# Patient Record
Sex: Female | Born: 1970 | Race: White | Hispanic: No | Marital: Married | Smoking: Former smoker
Health system: Southern US, Community
[De-identification: ages and names within clinical notes are randomized; demographics above are authoritative.]

## PROBLEM LIST (undated history)

## (undated) DIAGNOSIS — Q2381 Bicuspid aortic valve: Secondary | ICD-10-CM

## (undated) DIAGNOSIS — E079 Disorder of thyroid, unspecified: Secondary | ICD-10-CM

## (undated) DIAGNOSIS — F419 Anxiety disorder, unspecified: Secondary | ICD-10-CM

## (undated) DIAGNOSIS — F32A Depression, unspecified: Secondary | ICD-10-CM

## (undated) DIAGNOSIS — I719 Aortic aneurysm of unspecified site, without rupture: Secondary | ICD-10-CM

## (undated) DIAGNOSIS — K219 Gastro-esophageal reflux disease without esophagitis: Secondary | ICD-10-CM

## (undated) DIAGNOSIS — J45909 Unspecified asthma, uncomplicated: Secondary | ICD-10-CM

## (undated) DIAGNOSIS — R000 Tachycardia, unspecified: Secondary | ICD-10-CM

## (undated) DIAGNOSIS — Q231 Congenital insufficiency of aortic valve: Secondary | ICD-10-CM

## (undated) HISTORY — PX: BLADDER SUSPENSION: SHX72

## (undated) HISTORY — PX: ABDOMINAL HYSTERECTOMY: SHX81

## (undated) HISTORY — PX: TONSILLECTOMY: SUR1361

## (undated) HISTORY — PX: CHOLECYSTECTOMY: SHX55

## (undated) HISTORY — DX: Gastro-esophageal reflux disease without esophagitis: K21.9

---

## 2021-05-22 ENCOUNTER — Other Ambulatory Visit: Payer: Self-pay

## 2021-05-22 ENCOUNTER — Emergency Department (INDEPENDENT_AMBULATORY_CARE_PROVIDER_SITE_OTHER)
Admission: EM | Admit: 2021-05-22 | Discharge: 2021-05-22 | Disposition: A | Discharge status: Home / Self Care | Payer: Medicaid Other | Source: Home / Self Care

## 2021-05-22 ENCOUNTER — Encounter: Payer: Self-pay | Admitting: Emergency Medicine

## 2021-05-22 ENCOUNTER — Emergency Department (INDEPENDENT_AMBULATORY_CARE_PROVIDER_SITE_OTHER): Payer: Medicaid Other

## 2021-05-22 DIAGNOSIS — S93402A Sprain of unspecified ligament of left ankle, initial encounter: Secondary | ICD-10-CM | POA: Diagnosis not present

## 2021-05-22 DIAGNOSIS — S99912A Unspecified injury of left ankle, initial encounter: Secondary | ICD-10-CM

## 2021-05-22 HISTORY — DX: Tachycardia, unspecified: R00.0

## 2021-05-22 HISTORY — DX: Congenital insufficiency of aortic valve: Q23.1

## 2021-05-22 HISTORY — DX: Bicuspid aortic valve: Q23.81

## 2021-05-22 HISTORY — DX: Depression, unspecified: F32.A

## 2021-05-22 HISTORY — DX: Aortic aneurysm of unspecified site, without rupture: I71.9

## 2021-05-22 HISTORY — DX: Disorder of thyroid, unspecified: E07.9

## 2021-05-22 HISTORY — DX: Anxiety disorder, unspecified: F41.9

## 2021-05-22 HISTORY — DX: Unspecified asthma, uncomplicated: J45.909

## 2021-05-22 NOTE — Discharge Instructions (Addendum)
Ice to area of swelling.  Follow up for recheck

## 2021-05-22 NOTE — ED Triage Notes (Signed)
LT ankle injury this morning fell down 4 steps. Took 2 Tylenol

## 2021-05-22 NOTE — ED Provider Notes (Signed)
[facility]    CSN: [id number] Arrival date & time: 05/22/21  1013      History   Chief Complaint Chief Complaint  Patient presents with  . Ankle Injury    HPI Kathy Snyder is a 50 y.o. female.   No language interpreter was used.  Ankle Injury This is a new problem. The problem occurs constantly. Nothing aggravates the symptoms. Nothing relieves the symptoms. She has tried nothing for the symptoms.  Pt reports she twisted her ankle going down stairs Pt complains of swelling and pain  Past Medical History:  Diagnosis Date  . Anxiety   . Aortic aneurysm (HCC)   . Asthma   . Bicuspid aortic valve   . Depression   . Tachycardia   . Thyroid disease     There are no problems to display for this patient.     OB History   No obstetric history on file.      Home Medications    Prior to Admission medications   Medication Sig Start Date End Date Taking? Authorizing Provider  albuterol (ACCUNEB) 0.63 MG/3ML nebulizer solution Take 1 ampule by nebulization every 6 (six) hours as needed for wheezing.   Yes [provider]  ALPRAZolam Prudy Feeler) 0.5 MG tablet Take 0.5 mg by mouth at bedtime as needed for anxiety.   Yes [provider]  atenolol (TENORMIN) 50 MG tablet Take 50 mg by mouth daily.   Yes [provider]  atorvastatin (LIPITOR) 10 MG tablet Take 10 mg by mouth daily.   Yes [provider]  fluticasone (FLONASE) 50 MCG/ACT nasal spray Place into both nostrils daily.   Yes [provider]  fluticasone-salmeterol (ADVAIR) 100-50 MCG/ACT AEPB Inhale 1 puff into the lungs 2 (two) times daily.   Yes [provider]  levothyroxine (SYNTHROID) 25 MCG tablet Take 25 mcg by mouth daily before breakfast.   Yes [provider]  Misc. Devices (HEART RATE MONITOR) MISC by Does not apply route.   Yes [provider]  tiotropium (SPIRIVA) 18 MCG inhalation capsule Place 18 mcg into inhaler and  inhale daily.   Yes [provider]    Family History Family History  Problem Relation Age of Onset  . COPD Mother   . Diabetes Father     Social History Social History   Tobacco Use  . Smoking status: Former Smoker    Types: Cigarettes    Quit date: 09/19/2020    Years since quitting: 0.6  . Smokeless tobacco: Never Used  Vaping Use  . Vaping Use: Never used  Substance Use Topics  . Alcohol use: Not Currently  . Drug use: Not Currently     Allergies   Avelox [moxifloxacin], Morphine and related, and Zoloft [sertraline]   Review of Systems Review of Systems  Musculoskeletal: Positive for joint swelling.  All other systems reviewed and are negative.    Physical Exam Triage Vital Signs ED Triage Vitals  Enc Vitals Group     BP 05/22/21 1056 122/80     Pulse Rate 05/22/21 1056 63     Resp 05/22/21 1056 18     Temp 05/22/21 1056 98.7 F (37.1 C)     Temp src --      SpO2 05/22/21 1056 96 %     Weight 05/22/21 1100 175 lb (79.4 kg)     Height 05/22/21 1100 5\' 4"  (1.626 m)     Head Circumference --      Peak  Flow --      Pain Score 05/22/21 1100 6     Pain Loc --      Pain Edu? --      Excl. in GC? --    No data found.  Updated Vital Signs BP 122/80 (BP Location: Left Arm)   Pulse 63   Temp 98.7 F (37.1 C)   Resp 18   Ht 5\' 4"  (1.626 m)   Wt 79.4 kg   SpO2 96%   BMI 30.04 kg/m   Visual Acuity Right Eye Distance:   Left Eye Distance:   Bilateral Distance:    Right Eye Near:   Left Eye Near:    Bilateral Near:     Physical Exam Vitals and nursing note reviewed.  Constitutional:      Appearance: She is well-developed.  HENT:     Head: Normocephalic.  Abdominal:     General: There is no distension.  Musculoskeletal:        General: Swelling and tenderness present. Normal range of motion.     Cervical back: Normal range of motion.  Neurological:     Mental Status: She is alert and oriented to person, place, and time.   Psychiatric:        Mood and Affect: Mood normal.      UC Treatments / Results  Labs (all labs ordered are listed, but only abnormal results are displayed) Labs Reviewed - No data to display  EKG   Radiology DG Ankle Complete Left  Result Date: 05/22/2021 CLINICAL DATA:  Left ankle injury after tripping over a baby gate. Lateral pain and swelling. EXAM: LEFT ANKLE COMPLETE - 3+ VIEW COMPARISON:  None. FINDINGS: Subcutaneous edema abdomen below the malleoli without malleolar fracture identified. There is some faint linear bony irregularity laterally along the calcaneus on the frontal projection, such that avulsion along the anterior process in the vicinity of the extensor digitorum brevis attachment is not excluded. On the lateral projection, there is some linear lucency in the anterior calcaneus which could be a secondary sign of fracture. The plafond and talar dome appear intact. Plantar and Achilles calcaneal spurs. IMPRESSION: 1. Possible fracture of the anterior calcaneus, CT is recommended for further characterization. Electronically Signed   By: 05/24/2021 M.D.   On: 05/22/2021 11:43    Procedures Procedures (including critical care time)  Medications Ordered in UC Medications - No data to display  Initial Impression / Assessment and Plan / UC Course  I have reviewed the triage vital signs and the nursing notes.  Pertinent labs & imaging results that were available during my care of the patient were reviewed by me and considered in my medical decision making (see chart for details).     MDM:  Area of pain and swelling not near xray abnormality.  Pt counseled on results.  Pt placed in a cam walker and given crutches Pt advised to see Dr. 05/24/2021 for recheck in 1 week  Final Clinical Impressions(s) / UC Diagnoses   Final diagnoses:  Sprain of left ankle, unspecified ligament, initial encounter     Discharge Instructions     Ice to area of swelling.  Follow up for  recheck    ED Prescriptions    None     PDMP not reviewed this encounter.  An After Visit Summary was printed and given to the patient.    Karie Schwalbe, Elson Areas 05/22/21 1202

## 2021-05-29 ENCOUNTER — Ambulatory Visit (INDEPENDENT_AMBULATORY_CARE_PROVIDER_SITE_OTHER): Payer: Medicaid Other | Admitting: Sports Medicine

## 2021-05-29 ENCOUNTER — Encounter: Payer: Self-pay | Admitting: Sports Medicine

## 2021-05-29 ENCOUNTER — Other Ambulatory Visit: Payer: Self-pay

## 2021-05-29 DIAGNOSIS — S92002A Unspecified fracture of left calcaneus, initial encounter for closed fracture: Secondary | ICD-10-CM | POA: Insufficient documentation

## 2021-05-29 DIAGNOSIS — S92025A Nondisplaced fracture of anterior process of left calcaneus, initial encounter for closed fracture: Secondary | ICD-10-CM | POA: Diagnosis not present

## 2021-05-29 MED ORDER — HYDROCODONE-ACETAMINOPHEN 10-325 MG PO TABS: 0.5000 | ORAL_TABLET | Freq: Three times a day (TID) | ORAL | 0 refills | Status: DC | PRN

## 2021-05-29 NOTE — Assessment & Plan Note (Signed)
This is a pleasant 50 year old female, a days ago she took a tumble down the stairs, inverted her left ankle, she had immediate pain, swelling, bruising. She was seen in urgent care, x-rays showed what appears to be a nondisplaced fracture through the anterior process of the calcaneus, I tend to agree, her foot is bruised, swollen, exquisitely tender. She also has some pain at the ATFL. She likely has an ATFL sprain and a nondisplaced anterior process calcaneal fracture, continue boot for at least 3 more weeks, adding some hydrocodone for pain, we discussed PRICE. Return to see me in about 3 weeks, we will get an x-ray before the visit and consider switching/transitioning into an ASO at that point.

## 2021-05-29 NOTE — Progress Notes (Signed)
    Procedures performed today:    None.  Independent interpretation of notes and tests performed by another provider:   X-ray personally reviewed, there does appear to be a nondisplaced fracture through the anterior process of the calcaneus.  Brief History, Exam, Impression, and Recommendations:    Fracture of anterior process of calcaneus, left, closed This is a pleasant 50 year old female, a days ago she took a tumble down the stairs, inverted her left ankle, she had immediate pain, swelling, bruising. She was seen in urgent care, x-rays showed what appears to be a nondisplaced fracture through the anterior process of the calcaneus, I tend to agree, her foot is bruised, swollen, exquisitely tender. She also has some pain at the ATFL. She likely has an ATFL sprain and a nondisplaced anterior process calcaneal fracture, continue boot for at least 3 more weeks, adding some hydrocodone for pain, we discussed PRICE. Return to see me in about 3 weeks, we will get an x-ray before the visit and consider switching/transitioning into an ASO at that point.    ___________________________________________ Ihor Austin. Benjamin Stain, M.D., ABFM., CAQSM. Primary Care and Sports Medicine [facility]  Adjunct Instructor of Family Medicine  [employer]

## 2021-06-19 ENCOUNTER — Ambulatory Visit (INDEPENDENT_AMBULATORY_CARE_PROVIDER_SITE_OTHER): Payer: Medicaid Other

## 2021-06-19 ENCOUNTER — Ambulatory Visit (INDEPENDENT_AMBULATORY_CARE_PROVIDER_SITE_OTHER): Payer: Medicaid Other | Admitting: Sports Medicine

## 2021-06-19 ENCOUNTER — Other Ambulatory Visit: Payer: Self-pay

## 2021-06-19 DIAGNOSIS — S92025D Nondisplaced fracture of anterior process of left calcaneus, subsequent encounter for fracture with routine healing: Secondary | ICD-10-CM | POA: Diagnosis not present

## 2021-06-19 DIAGNOSIS — S92025A Nondisplaced fracture of anterior process of left calcaneus, initial encounter for closed fracture: Secondary | ICD-10-CM

## 2021-06-19 MED ORDER — HYDROCODONE-ACETAMINOPHEN 10-325 MG PO TABS: 0.5000 | ORAL_TABLET | Freq: Three times a day (TID) | ORAL | 0 refills | Status: AC | PRN

## 2021-06-19 NOTE — Assessment & Plan Note (Signed)
This is a pleasant 50 year old female, she is now 3 weeks post fall down the stairs with suspected nondisplaced fracture through the anterior process of the calcaneus as well as ATFL sprain. She has improved considerably after 3 weeks, she still has some discomfort mostly over the ATFL and the dorsolateral midfoot, not so much tenderness over the calcaneus anymore. Continue boot for 3 more weeks, refilling hydrocodone, after 3 weeks we will get her out of the boot and she will likely need an ASO and some formal PT.

## 2021-06-19 NOTE — Progress Notes (Signed)
    Procedures performed today:    None.  Independent interpretation of notes and tests performed by another provider:   X-rays personally reviewed, I can no longer see the fracture line through the anterior process of the calcaneus.  Brief History, Exam, Impression, and Recommendations:    Fracture of anterior process of calcaneus, left, closed This is a pleasant 50 year old female, she is now 3 weeks post fall down the stairs with suspected nondisplaced fracture through the anterior process of the calcaneus as well as ATFL sprain. She has improved considerably after 3 weeks, she still has some discomfort mostly over the ATFL and the dorsolateral midfoot, not so much tenderness over the calcaneus anymore. Continue boot for 3 more weeks, refilling hydrocodone, after 3 weeks we will get her out of the boot and she will likely need an ASO and some formal PT.    ___________________________________________ Ihor Austin. Benjamin Stain, M.D., ABFM., CAQSM. Primary Care and Sports Medicine [facility]  Adjunct Instructor of Family Medicine  [employer]

## 2021-07-10 ENCOUNTER — Other Ambulatory Visit: Payer: Self-pay

## 2021-07-10 ENCOUNTER — Ambulatory Visit (INDEPENDENT_AMBULATORY_CARE_PROVIDER_SITE_OTHER): Payer: Medicaid Other | Admitting: Sports Medicine

## 2021-07-10 DIAGNOSIS — S92025D Nondisplaced fracture of anterior process of left calcaneus, subsequent encounter for fracture with routine healing: Secondary | ICD-10-CM

## 2021-07-10 NOTE — Progress Notes (Signed)
    Procedures performed today:    None.  Independent interpretation of notes and tests performed by another provider:   None.  Brief History, Exam, Impression, and Recommendations:    Fracture of anterior process of calcaneus, left, closed Pleasant 50 year old female, she is now approximately 6 weeks post a fall down the stairs with anterior process fracture of the calcaneus and ATFL sprain, she has now been in a boot for 6 weeks, she has no tenderness over the fracture, she does have some discomfort over the midfoot/forefoot articulation that I think is likely from prolonged boot wear. She will transition into an ASO, they think they have 1 at home, I would like to see this on her when she comes back in 3 weeks. Conditioning exercises given, return to see me in 3 weeks.    ___________________________________________ Ihor Austin. Benjamin Stain, M.D., ABFM., CAQSM. Primary Care and Sports Medicine [facility]  Adjunct Instructor of Family Medicine  [employer]

## 2021-07-10 NOTE — Assessment & Plan Note (Signed)
Pleasant 50 year old female, she is now approximately 6 weeks post a fall down the stairs with anterior process fracture of the calcaneus and ATFL sprain, she has now been in a boot for 6 weeks, she has no tenderness over the fracture, she does have some discomfort over the midfoot/forefoot articulation that I think is likely from prolonged boot wear. She will transition into an ASO, they think they have 1 at home, I would like to see this on her when she comes back in 3 weeks. Conditioning exercises given, return to see me in 3 weeks.

## 2021-07-31 ENCOUNTER — Ambulatory Visit: Payer: Medicaid Other | Admitting: Sports Medicine

## 2022-04-26 IMAGING — DX DG FOOT COMPLETE 3+V*L*
3 series · 3 of 3 positions shown · non-contrast
Comparison: May 22, 2021

CLINICAL DATA: Follow-up possible fracture

EXAM:
LEFT FOOT - COMPLETE 3+ VIEW

[foot ap]
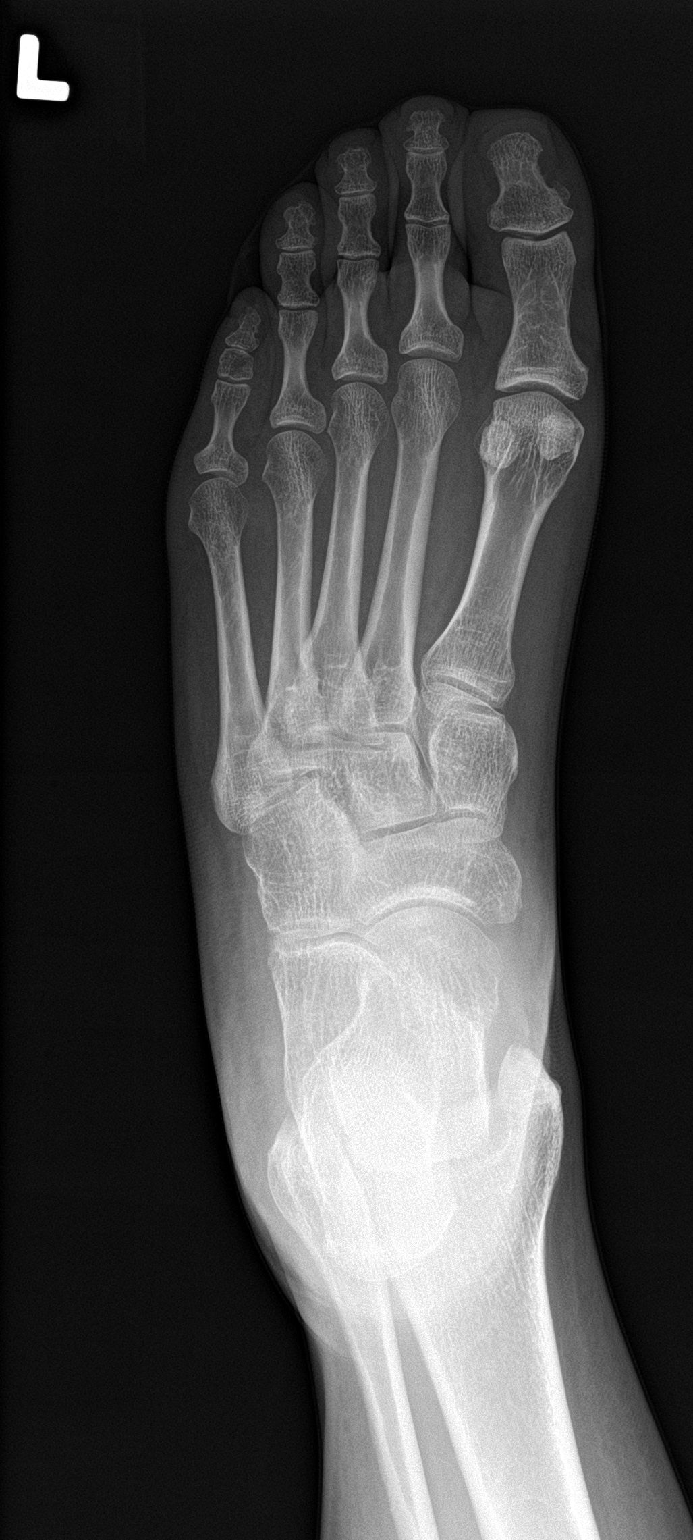

[foot obl]
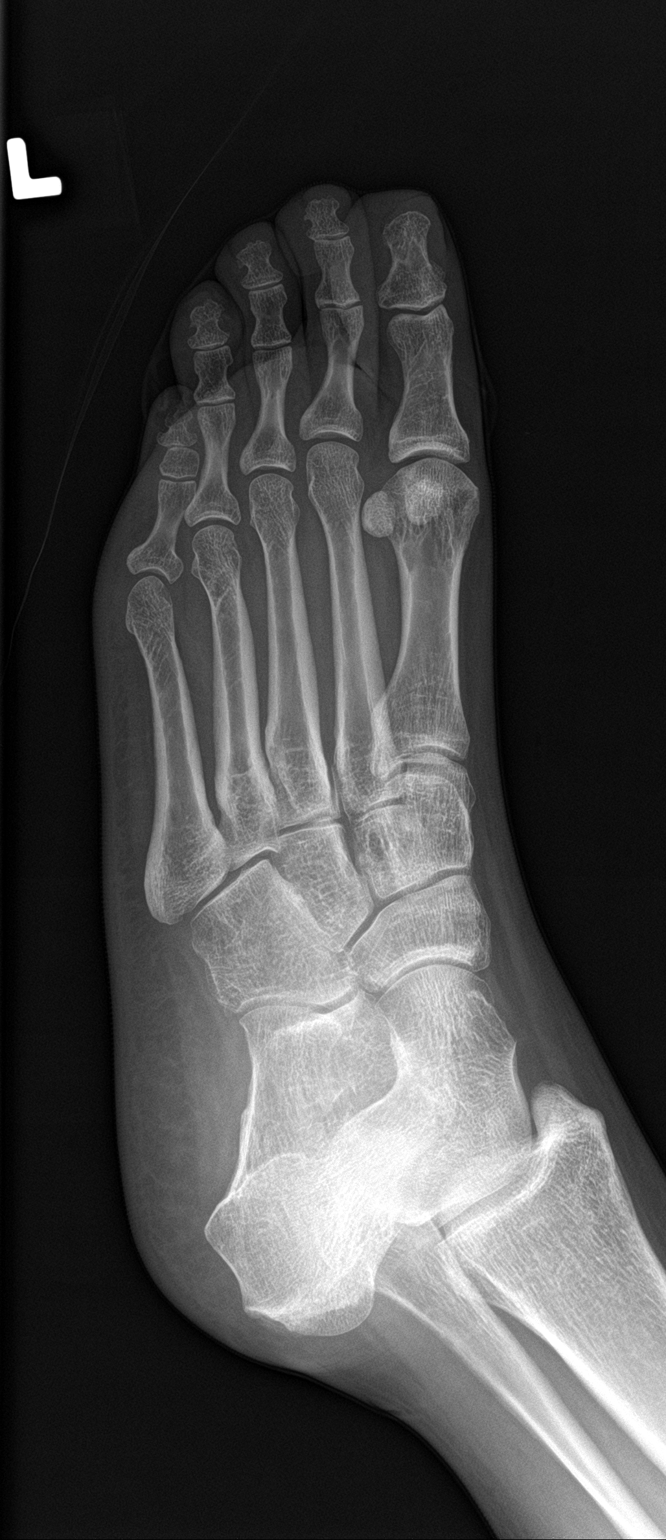

[foot lat]
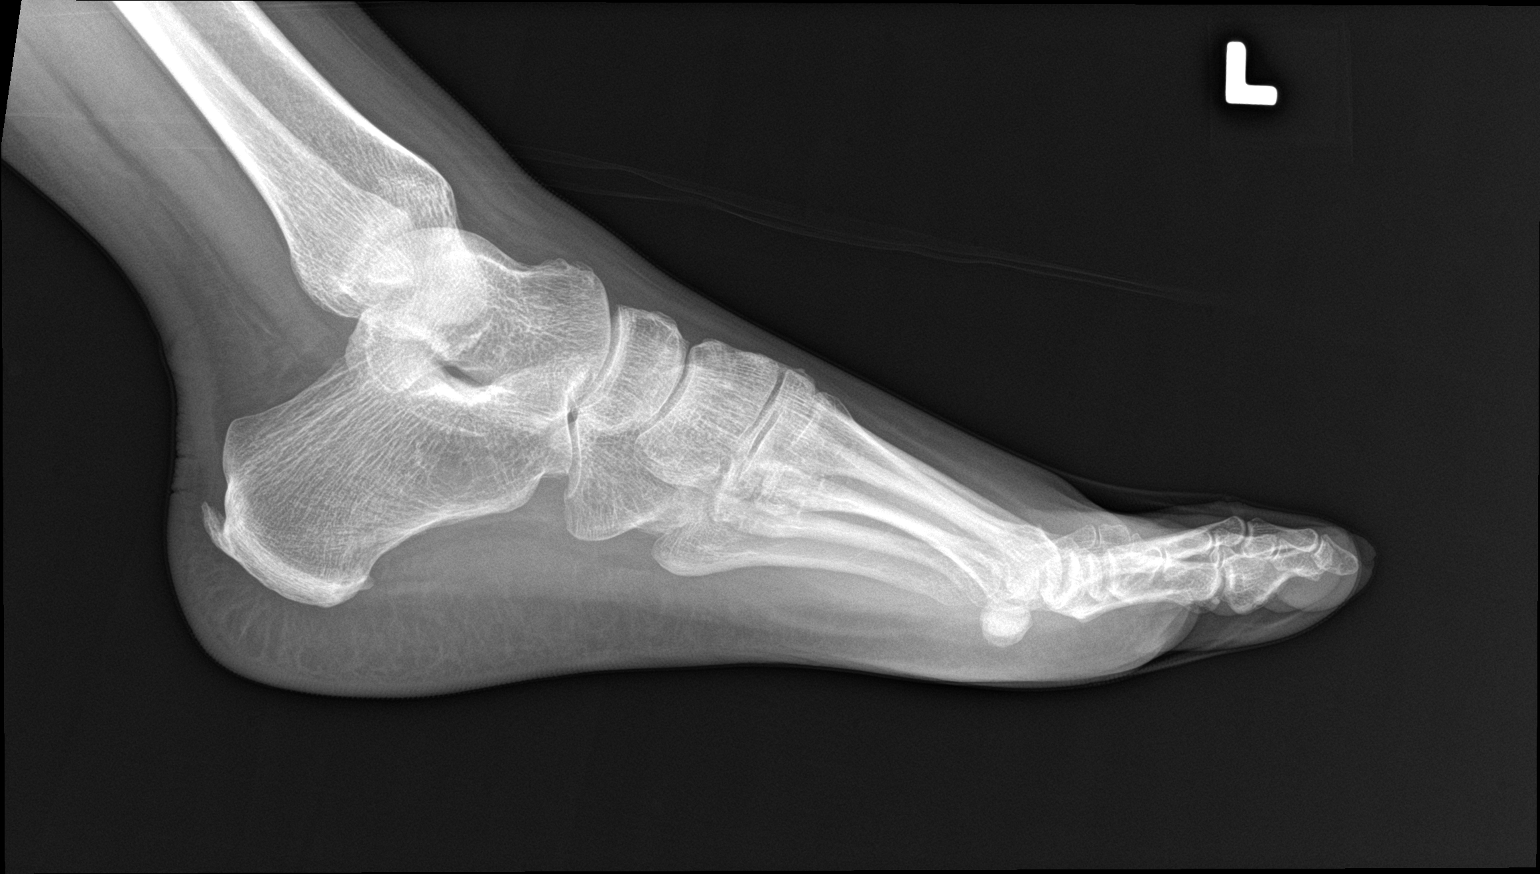

[3 of 3 positions shown; findings below may reference images not displayed]

FINDINGS: No acute fracture or dislocation. Enthesophyte of the Achilles
tendon. Previously described lucency of the anterior calcaneus is
not well appreciated on today's exam. No area of erosion or osseous
destruction. No unexpected radiopaque foreign body. Soft tissues are
unremarkable.
IMPRESSION: Previously described lucency of the anterior calcaneus is not well
appreciated on today's exam. No definitive acute fracture or
dislocation. If persistent concern, could consider dedicated MRI or
CT.

## 2024-07-17 ENCOUNTER — Other Ambulatory Visit: Payer: Self-pay

## 2024-07-17 ENCOUNTER — Ambulatory Visit
Admission: EM | Admit: 2024-07-17 | Discharge: 2024-07-17 | Disposition: A | Discharge status: Home / Self Care | Attending: Family Medicine | Admitting: Family Medicine

## 2024-07-17 DIAGNOSIS — H6691 Otitis media, unspecified, right ear: Secondary | ICD-10-CM

## 2024-07-17 DIAGNOSIS — H6121 Impacted cerumen, right ear: Secondary | ICD-10-CM

## 2024-07-17 MED ORDER — AMOXICILLIN-POT CLAVULANATE 875-125 MG PO TABS: 1.0000 | ORAL_TABLET | Freq: Two times a day (BID) | ORAL | 0 refills | Status: AC

## 2024-07-17 NOTE — Discharge Instructions (Addendum)
Advised patient to take medication as directed with food to completion.  Encouraged to increase daily water intake to 64 ounces per day while taking this medication.  Advised if symptoms worsen and/or unresolved please follow-up with your PCP or ENT for further evaluation.

## 2024-07-17 NOTE — ED Provider Notes (Signed)
 [facility]    CSN: [id number] Arrival date & time: 07/17/24  1504      History   Chief Complaint No chief complaint on file.   HPI Kathy Snyder is a 53 y.o. female.   HPI 53 year old female presents with right ear pain for 2 days reports when this happened before she had clogged earwax requiring removal.  PMH significant for obesity, aortic aneurysm, and thyroid disease  Past Medical History:  Diagnosis Date   Anxiety    Aortic aneurysm (HCC)    Asthma    Bicuspid aortic valve    Depression    GERD (gastroesophageal reflux disease)    Tachycardia    Thyroid disease     Patient Active Problem List   Diagnosis Date Noted   Fracture of anterior process of calcaneus, left, closed 05/29/2021      OB History   No obstetric history on file.      Home Medications    Prior to Admission medications   Medication Sig Start Date End Date Taking? Authorizing Provider  amoxicillin -clavulanate (AUGMENTIN ) 875-125 MG tablet Take 1 tablet by mouth every 12 (twelve) hours. 07/17/24  Yes Teddy Sharper, FNP  ADVAIR DISKUS 250-50 MCG/ACT AEPB Inhale 1 puff into the lungs 2 (two) times daily. 04/30/21   [provider]  albuterol (ACCUNEB) 0.63 MG/3ML nebulizer solution Take 1 ampule by nebulization every 6 (six) hours as needed for wheezing.    [provider]  ALPRAZolam (XANAX) 0.5 MG tablet Take 0.5 mg by mouth at bedtime as needed for anxiety.    [provider]  atenolol (TENORMIN) 50 MG tablet Take 50 mg by mouth daily.    [provider]  atorvastatin (LIPITOR) 10 MG tablet Take 10 mg by mouth daily.    [provider]  fluticasone (FLONASE) 50 MCG/ACT nasal spray Place into both nostrils daily.    [provider]  HYDROcodone -acetaminophen  (NORCO) 10-325 MG tablet Take 0.5-1 tablets by mouth every 8 (eight) hours as needed. 06/19/21   Curtis Debby PARAS, MD  levothyroxine (SYNTHROID) 25 MCG tablet Take 25  mcg by mouth daily before breakfast.    [provider]  Misc. Devices (HEART RATE MONITOR) MISC by Does not apply route.    [provider]  montelukast (SINGULAIR) 10 MG tablet Take by mouth. 11/27/20   [provider]  tiotropium (SPIRIVA) 18 MCG inhalation capsule Place 18 mcg into inhaler and inhale daily.    [provider]  Tiotropium Bromide Monohydrate (SPIRIVA RESPIMAT) 1.25 MCG/ACT AERS SMARTSIG:2 Puff(s) Via Inhaler Daily 03/16/21   [provider]    Family History Family History  Problem Relation Age of Onset   COPD Mother    Diabetes Father     Social History Social History   Tobacco Use   Smoking status: Former    Current packs/day: 0.00    Types: Cigarettes    Quit date: 09/19/2020    Years since quitting: 3.8   Smokeless tobacco: Never  Vaping Use   Vaping status: Never Used  Substance Use Topics   Alcohol use: Not Currently   Drug use: Not Currently     Allergies   Avelox [moxifloxacin], Morphine and codeine, Zoloft [sertraline], and Measles mumps and rubella vaccine live (obsolete)   Review of Systems Review of Systems  HENT:  Positive for ear pain.      Physical Exam Triage Vital Signs ED Triage Vitals [07/17/24 1516]  Encounter Vitals Group  BP      Girls Systolic BP Percentile      Girls Diastolic BP Percentile      Boys Systolic BP Percentile      Boys Diastolic BP Percentile      Pulse      Resp      Temp      Temp src      SpO2      Weight      Height      Head Circumference      Peak Flow      Pain Score 7     Pain Loc      Pain Education      Exclude from Growth Chart    No data found.  Updated Vital Signs BP 105/65   Pulse 67   Temp 98.7 F (37.1 C)   Resp 19   SpO2 98%   Visual Acuity Right Eye Distance:   Left Eye Distance:   Bilateral Distance:    Right Eye Near:   Left Eye Near:    Bilateral Near:     Physical Exam Vitals and nursing note reviewed.   Constitutional:      Appearance: Normal appearance. She is normal weight.  HENT:     Head: Normocephalic and atraumatic.     Right Ear: External ear normal.     Left Ear: Tympanic membrane, ear canal and external ear normal.     Ears:     Comments: Right EAC occluded with excessive cerumen unable to visualize right TM.  Post right EAC lavage: Clear, mildly erythematous from previous cerumen accumulation; Right TM: Red rimmed, retracted    Mouth/Throat:     Mouth: Mucous membranes are moist.     Pharynx: Oropharynx is clear.  Eyes:     Extraocular Movements: Extraocular movements intact.     Conjunctiva/sclera: Conjunctivae normal.     Pupils: Pupils are equal, round, and reactive to light.  Cardiovascular:     Rate and Rhythm: Normal rate and regular rhythm.     Pulses: Normal pulses.     Heart sounds: Normal heart sounds.  Pulmonary:     Effort: Pulmonary effort is normal.     Breath sounds: Normal breath sounds. No wheezing, rhonchi or rales.  Skin:    General: Skin is warm and dry.  Neurological:     General: No focal deficit present.     Mental Status: She is alert and oriented to person, place, and time. Mental status is at baseline.      UC Treatments / Results  Labs (all labs ordered are listed, but only abnormal results are displayed) Labs Reviewed - No data to display  EKG   Radiology No results found.  Procedures Procedures (including critical care time)  Medications Ordered in UC Medications - No data to display  Initial Impression / Assessment and Plan / UC Course  I have reviewed the triage vital signs and the nursing notes.  Pertinent labs & imaging results that were available during my care of the patient were reviewed by me and considered in my medical decision making (see chart for details).     MDM: 1.  Acute right otitis media-Rx'd Augmentin  875/125 mg tablet: Take 1 tablet twice daily x 7 days. Advised patient to take medication as directed  with food to completion.  Encouraged to increase daily water intake to 64 ounces per day while taking this medication.  Advised if symptoms worsen and or unresolved  please follow-up with your PCP or ENT for further evaluation.  Patient discharged home, hemodynamically stable. Final Clinical Impressions(s) / UC Diagnoses   Final diagnoses:  Impacted cerumen of right ear  Acute right otitis media     Discharge Instructions      Advised patient to take medication as directed with food to completion.  Encouraged to increase daily water intake to 64 ounces per day while taking this medication.  Advised if symptoms worsen and or unresolved please follow-up with your PCP or ENT for further evaluation.     ED Prescriptions     Medication Sig Dispense Auth. Provider   amoxicillin -clavulanate (AUGMENTIN ) 875-125 MG tablet Take 1 tablet by mouth every 12 (twelve) hours. 14 tablet Bernise Sylvain, FNP      PDMP not reviewed this encounter.   Teddy Sharper, FNP 07/17/24 [id number]

## 2024-07-17 NOTE — ED Triage Notes (Addendum)
 Pt presents to uc with co right ear pain since  for 2 days pt reports this has happened before and she had a clogged ear wax requiring remove.
